# Patient Record
Sex: Female | Born: 1995 | Race: White | Hispanic: No | Marital: Single
Health system: Southern US, Community
[De-identification: ages and names within clinical notes are randomized; demographics above are authoritative.]

## PROBLEM LIST (undated history)

## (undated) DIAGNOSIS — F32A Depression, unspecified: Secondary | ICD-10-CM

## (undated) DIAGNOSIS — F419 Anxiety disorder, unspecified: Secondary | ICD-10-CM

## (undated) DIAGNOSIS — F329 Major depressive disorder, single episode, unspecified: Secondary | ICD-10-CM

## (undated) HISTORY — PX: TONSILLECTOMY: SUR1361

---

## 2016-02-18 ENCOUNTER — Ambulatory Visit: Payer: Medicaid Other

## 2016-02-18 ENCOUNTER — Ambulatory Visit
Admission: EM | Admit: 2016-02-18 | Discharge: 2016-02-18 | Disposition: A | Discharge status: Home / Self Care | Payer: Medicaid Other | Attending: Family Medicine | Admitting: Family Medicine

## 2016-02-18 DIAGNOSIS — R109 Unspecified abdominal pain: Secondary | ICD-10-CM | POA: Diagnosis present

## 2016-02-18 DIAGNOSIS — R1013 Epigastric pain: Secondary | ICD-10-CM | POA: Insufficient documentation

## 2016-02-18 DIAGNOSIS — Z3202 Encounter for pregnancy test, result negative: Secondary | ICD-10-CM | POA: Diagnosis not present

## 2016-02-18 DIAGNOSIS — D509 Iron deficiency anemia, unspecified: Secondary | ICD-10-CM | POA: Diagnosis not present

## 2016-02-18 HISTORY — DX: Major depressive disorder, single episode, unspecified: F32.9

## 2016-02-18 HISTORY — DX: Depression, unspecified: F32.A

## 2016-02-18 HISTORY — DX: Anxiety disorder, unspecified: F41.9

## 2016-02-18 LAB — COMPREHENSIVE METABOLIC PANEL
ALT: 12 U/L — ABNORMAL LOW (ref 14–54)
AST: 18 U/L (ref 15–41)
Albumin: 4.3 g/dL (ref 3.5–5.0)
Alkaline Phosphatase: 72 U/L (ref 38–126)
Anion gap: 9 (ref 5–15)
BUN: 7 mg/dL (ref 6–20)
CO2: 24 mmol/L (ref 22–32)
Calcium: 8.9 mg/dL (ref 8.9–10.3)
Chloride: 100 mmol/L — ABNORMAL LOW (ref 101–111)
Creatinine, Ser: 0.49 mg/dL (ref 0.44–1.00)
GFR calc Af Amer: >60 mL/min (ref >60)
GFR calc non Af Amer: >60 mL/min (ref >60)
Glucose, Bld: 89 mg/dL (ref 65–99)
Potassium: 3.6 mmol/L (ref 3.5–5.1)
Sodium: 133 mmol/L — ABNORMAL LOW (ref 135–145)
Total Bilirubin: 0.5 mg/dL (ref 0.3–1.2)
Total Protein: 7.6 g/dL (ref 6.5–8.1)

## 2016-02-18 LAB — CBC WITH DIFFERENTIAL/PLATELET
Basophils Absolute: 0 10*3/uL (ref 0–0.1)
Basophils Relative: 0 %
Eosinophils Absolute: 0 10*3/uL (ref 0–0.7)
Eosinophils Relative: 0 %
HCT: 31.5 % — ABNORMAL LOW (ref 35.0–47.0)
Hemoglobin: 9.7 g/dL — ABNORMAL LOW (ref 12.0–16.0)
Lymphocytes Relative: 30 %
Lymphs Abs: 1.2 10*3/uL (ref 1.0–3.6)
MCH: 21.8 pg — ABNORMAL LOW (ref 26.0–34.0)
MCHC: 30.9 g/dL — ABNORMAL LOW (ref 32.0–36.0)
MCV: 70.4 fL — ABNORMAL LOW (ref 80.0–100.0)
Monocytes Absolute: 0.4 10*3/uL (ref 0.2–0.9)
Monocytes Relative: 10 %
Neutro Abs: 2.4 10*3/uL (ref 1.4–6.5)
Neutrophils Relative %: 60 %
Platelets: 341 10*3/uL (ref 150–440)
RBC: 4.48 MIL/uL (ref 3.80–5.20)
RDW: 16.4 % — ABNORMAL HIGH (ref 11.5–14.5)
WBC: 4.1 10*3/uL (ref 3.6–11.0)

## 2016-02-18 LAB — URINALYSIS, COMPLETE (UACMP) WITH MICROSCOPIC
Bacteria, UA: NONE SEEN
Glucose, UA: NEGATIVE mg/dL
HGB URINE DIPSTICK: NEGATIVE
LEUKOCYTES UA: NEGATIVE
NITRITE: NEGATIVE
PROTEIN: NEGATIVE mg/dL
Specific Gravity, Urine: >1.03 — ABNORMAL HIGH (ref 1.005–1.030)
pH: 6 (ref 5.0–8.0)

## 2016-02-18 LAB — LIPASE, BLOOD: Lipase: 12 U/L (ref 11–51)

## 2016-02-18 LAB — PREGNANCY, URINE: Preg Test, Ur: NEGATIVE

## 2016-02-18 MED ORDER — DEXLANSOPRAZOLE 30 MG PO CPDR: 30.0000 mg | DELAYED_RELEASE_CAPSULE | Freq: Every day | ORAL | 0 refills | Status: DC

## 2016-02-18 NOTE — ED Triage Notes (Signed)
Patient complains of abdominal pain. Patient states that she has been having the pain x 3 weeks. Patient describes the pain as being mid abdomen with cramping at times off and on.

## 2016-02-18 NOTE — ED Provider Notes (Signed)
CSN: [id number]     Arrival date & time 02/18/16  1057 History   First MD Initiated Contact with Patient 02/18/16 1456     Chief Complaint  Patient presents with  . Abdominal Pain   (Consider location/radiation/quality/duration/timing/severity/associated sxs/prior Treatment) HPI  21 year old female who presents with abdominal pain that she indicates periumbilical. He said at times will feel as if it's radiating into the epigastric region. She has only vomited twice. States that the pain is intermittent but at times feels constant and worsens over a period of time but she is not specific as to the exact timing. She does not state that it relates to food or drink. He denies any diarrhea. Normal bowel movement was this morning. No urinary symptoms does not have any vaginal discharge. Her last menstrual cycle finished on Sunday. She has tried antinausea medicine that she got from someone else and states it did not help. History of GERD in the past and has doubled up on her proton pump inhibitors but has not noticed any improvement. His had no fever or chills.      Past Medical History:  Diagnosis Date  . Anxiety   . Depression    Past Surgical History:  Procedure Laterality Date  . TONSILLECTOMY     Family History  Problem Relation Age of Onset  . Neuropathy Mother    Social History  Substance Use Topics  . Smoking status: Never Smoker  . Smokeless tobacco: Never Used  . Alcohol use No   OB History    No data available     Review of Systems  Constitutional: Positive for activity change. Negative for chills, fatigue and fever.  Respiratory: Negative for cough and shortness of breath.   Gastrointestinal: Positive for abdominal pain, nausea and vomiting. Negative for abdominal distention, anal bleeding, blood in stool, constipation, diarrhea and rectal pain.  All other systems reviewed and are negative.   Allergies  Patient has no known allergies.  Home Medications   Prior  to Admission medications   Medication Sig Start Date End Date Taking? Authorizing Provider  busPIRone (BUSPAR) 15 MG tablet Take 15 mg by mouth 3 (three) times daily.   Yes Historical Provider, MD  DULoxetine (CYMBALTA) 60 MG capsule Take 60 mg by mouth daily.   Yes Historical Provider, MD  norethindrone-ethinyl estradiol-iron (ESTROSTEP FE,TILIA FE,TRI-LEGEST FE) 1-20/1-30/1-35 MG-MCG tablet Take 1 tablet by mouth daily.   Yes Historical Provider, MD  omeprazole (PRILOSEC) 40 MG capsule Take 40 mg by mouth daily.   Yes Historical Provider, MD  Dexlansoprazole (DEXILANT) 30 MG capsule Take 1 capsule (30 mg total) by mouth daily. 02/18/16   Lutricia FeilWilliam P Rosangela Fehrenbach, PA-C   Meds Ordered and Administered this Visit  Medications - No data to display  BP 128/83 (BP Location: Left Arm)   Pulse 60   Temp 98.3 F (36.8 C) (Oral)   Resp 18   Ht 5\' 1"  (1.549 m)   Wt 110 lb (49.9 kg)   LMP 02/10/2016 Comment: neg preg  SpO2 100%   BMI 20.78 kg/m  No data found.   Physical Exam  Constitutional: She is oriented to person, place, and time. She appears well-developed and well-nourished. No distress.  HENT:  Head: Normocephalic and atraumatic.  Right Ear: External ear normal.  Left Ear: External ear normal.  Nose: Nose normal.  Mouth/Throat: Oropharynx is clear and moist. No oropharyngeal exudate.  Eyes: EOM are normal. Pupils are equal, round, and reactive to light. Right eye exhibits  no discharge. Left eye exhibits no discharge.  Neck: Normal range of motion. Neck supple.  Pulmonary/Chest: Effort normal and breath sounds normal. No respiratory distress. She has no wheezes. She has no rales.  Abdominal: Soft. Bowel sounds are normal. She exhibits no distension and no mass. There is tenderness. There is no rebound and no guarding.  Patient has tenderness in the periumbilical region but most noticeable is the epigastric tenderness. She is a negative Murphy sign. No rebound or guarding present.   Musculoskeletal: Normal range of motion.  Lymphadenopathy:    She has no cervical adenopathy.  Neurological: She is alert and oriented to person, place, and time.  Skin: Skin is warm and dry. She is not diaphoretic.  Psychiatric: She has a normal mood and affect. Her behavior is normal. Judgment and thought content normal.  Nursing note and vitals reviewed.   Urgent Care Course     Procedures (including critical care time)  Labs Review Labs Reviewed  URINALYSIS, COMPLETE (UACMP) WITH MICROSCOPIC - Abnormal; Notable for the following:       Result Value   Specific Gravity, Urine >1.030 (*)    Bilirubin Urine SMALL (*)    Ketones, ur >160 (*)    Squamous Epithelial / LPF 6-30 (*)    All other components within normal limits  COMPREHENSIVE METABOLIC PANEL - Abnormal; Notable for the following:    Sodium 133 (*)    Chloride 100 (*)    ALT 12 (*)    All other components within normal limits  CBC WITH DIFFERENTIAL/PLATELET - Abnormal; Notable for the following:    Hemoglobin 9.7 (*)    HCT 31.5 (*)    MCV 70.4 (*)    MCH 21.8 (*)    MCHC 30.9 (*)    RDW 16.4 (*)    All other components within normal limits  PREGNANCY, URINE  LIPASE, BLOOD    Imaging Review Dg Abd Acute W/chest  Result Date: 02/18/2016 CLINICAL DATA:  Abdominal pain for several weeks EXAM: DG ABDOMEN ACUTE W/ 1V CHEST COMPARISON:  None. FINDINGS: There is no evidence of dilated bowel loops or free intraperitoneal air. No radiopaque calculi or other significant radiographic abnormality is seen. Heart size and mediastinal contours are within normal limits. Both lungs are clear. IMPRESSION: Negative abdominal radiographs.  No acute cardiopulmonary disease. Electronically Signed   By: Alcide Clever M.D.   On: 02/18/2016 16:36     Visual Acuity Review  Right Eye Distance:   Left Eye Distance:   Bilateral Distance:    Right Eye Near:   Left Eye Near:    Bilateral Near:         MDM   1. Epigastric  pain   2. Iron deficiency anemia, unspecified iron deficiency anemia type    Discharge Medication List as of 02/18/2016  5:07 PM    START taking these medications   Details  Dexlansoprazole (DEXILANT) 30 MG capsule Take 1 capsule (30 mg total) by mouth daily., Starting Tue 02/18/2016, Normal      Plan: 1. Test/x-ray results and diagnosis reviewed with patient 2. rx as per orders; risks, benefits, potential side effects reviewed with patient 3. Recommend supportive treatment with discontinuing her current PPI and switched to the new one. Follow-up with her primary care physician next week. All of her labs were normal except for her iron deficiency anemia. He did not know she had anemia. She states that her periods are not excessive. Need to have further workup.Marland Kitchen 4.  F/u prn if symptoms worsen or don't improve     Lutricia Feil, PA-C 02/18/16 1717

## 2019-09-07 ENCOUNTER — Other Ambulatory Visit: Payer: Self-pay

## 2019-09-07 ENCOUNTER — Ambulatory Visit (INDEPENDENT_AMBULATORY_CARE_PROVIDER_SITE_OTHER): Payer: Self-pay

## 2019-09-07 ENCOUNTER — Ambulatory Visit
Admission: EM | Admit: 2019-09-07 | Discharge: 2019-09-07 | Disposition: A | Discharge status: Home / Self Care | Payer: Self-pay | Attending: Family Medicine | Admitting: Family Medicine

## 2019-09-07 ENCOUNTER — Ambulatory Visit: Admit: 2019-09-07 | Disposition: A | Discharge status: Home / Self Care | Payer: Self-pay | Source: Home / Self Care

## 2019-09-07 DIAGNOSIS — S93602A Unspecified sprain of left foot, initial encounter: Secondary | ICD-10-CM

## 2019-09-07 DIAGNOSIS — M79672 Pain in left foot: Secondary | ICD-10-CM

## 2019-09-07 MED ORDER — MELOXICAM 15 MG PO TABS: 15.0000 mg | ORAL_TABLET | Freq: Every day | ORAL | 0 refills | Status: DC | PRN

## 2019-09-07 NOTE — ED Triage Notes (Signed)
Patient states that she fell down concrete steps on Monday and injured her left foot. Patient with abrasion and swelling to left foot. States the swelling has increased and pain has remained the same.

## 2019-09-07 NOTE — Discharge Instructions (Signed)
Rest, ice, elevation.  Medication as prescribed.  Take care  Dr. Daiki Dicostanzo  

## 2019-09-07 NOTE — ED Provider Notes (Signed)
[facility]    CSN: [id number] Arrival date & time: 09/07/19  1920      History   Chief Complaint Chief Complaint  Patient presents with   Foot Pain   Fall   HPI  24 year old female presents with the above complaints.  Patient states that she fell down some concrete steps on Monday and injured her left foot.  She has an abrasion to the dorsum of her foot.  She reports pain and swelling of the dorsum of her foot.  She went to work today and she has significant swelling currently.  This is likely due to being on her feet all day.  Pain 7/10 in severity.  Denies ankle pain.  She is currently on crutches and has difficulty ambulating.  No other associated symptoms.  No other complaints.  Past Medical History:  Diagnosis Date   Anxiety    Depression    Past Surgical History:  Procedure Laterality Date   TONSILLECTOMY     OB History   No obstetric history on file.    Home Medications    Prior to Admission medications   Medication Sig Start Date End Date Taking? Authorizing Provider  norethindrone-ethinyl estradiol-iron (ESTROSTEP FE,TILIA FE,TRI-LEGEST FE) 1-20/1-30/1-35 MG-MCG tablet Take 1 tablet by mouth daily.   Yes [provider]  meloxicam (MOBIC) 15 MG tablet Take 1 tablet (15 mg total) by mouth daily as needed for pain. 09/07/19   Tommie Sams, DO  Dexlansoprazole (DEXILANT) 30 MG capsule Take 1 capsule (30 mg total) by mouth daily. 02/18/16 09/07/19  Lutricia Feil, PA-C  DULoxetine (CYMBALTA) 60 MG capsule Take 60 mg by mouth daily.  09/07/19  [provider]  omeprazole (PRILOSEC) 40 MG capsule Take 40 mg by mouth daily.  09/07/19  [provider]   Family History Family History  Problem Relation Age of Onset   Neuropathy Mother    Social History Social History   Tobacco Use   Smoking status: Never Smoker   Smokeless tobacco: Never Used  Building services engineer Use: Never used  Substance Use Topics   Alcohol use:  No   Drug use: Yes    Types: Marijuana   Allergies   Patient has no known allergies.  Review of Systems Review of Systems  Musculoskeletal:       Left foot pain, swelling.  Skin: Positive for wound.   Physical Exam Triage Vital Signs ED Triage Vitals  Enc Vitals Group     BP 09/07/19 2005 128/89     Pulse Rate 09/07/19 2005 83     Resp 09/07/19 2005 16     Temp 09/07/19 2005 98.6 F (37 C)     Temp Source 09/07/19 2005 Oral     SpO2 09/07/19 2005 100 %     Weight 09/07/19 2002 120 lb (54.4 kg)     Height 09/07/19 2002 5\' 1"  (1.549 m)     Head Circumference --      Peak Flow --      Pain Score 09/07/19 2002 7     Pain Loc --      Pain Edu? --      Excl. in GC? --    Updated Vital Signs BP 128/89 (BP Location: Right Arm)    Pulse 83    Temp 98.6 F (37 C) (Oral)    Resp 16    Ht 5\' 1"  (1.549 m)    Wt 54.4 kg    LMP  08/17/2019    SpO2 100%    BMI 22.67 kg/m   Visual Acuity Right Eye Distance:   Left Eye Distance:   Bilateral Distance:    Right Eye Near:   Left Eye Near:    Bilateral Near:     Physical Exam Vitals and nursing note reviewed.  Constitutional:      General: She is not in acute distress.    Appearance: Normal appearance. She is not ill-appearing.  HENT:     Head: Normocephalic and atraumatic.  Eyes:     General:        Right eye: No discharge.        Left eye: No discharge.     Conjunctiva/sclera: Conjunctivae normal.  Pulmonary:     Effort: Pulmonary effort is normal. No respiratory distress.  Musculoskeletal:     Comments: Left foot -tenderness over the dorsum of the foot.  Abrasion noted.  Mild swelling.  Neurological:     Mental Status: She is alert.  Psychiatric:        Mood and Affect: Mood normal.        Behavior: Behavior normal.    UC Treatments / Results  Labs (all labs ordered are listed, but only abnormal results are displayed) Labs Reviewed - No data to display  EKG   Radiology DG Foot Complete Left  Result Date:  09/07/2019 CLINICAL DATA:  Larey Seat down stairs 3 days ago, left foot pain EXAM: LEFT FOOT - COMPLETE 3+ VIEW COMPARISON:  None. FINDINGS: Frontal, oblique, and lateral views of the left foot are obtained. No acute displaced fracture, subluxation, or dislocation. Dorsal soft tissue swelling of the forefoot. Joint spaces are well preserved. IMPRESSION: 1. Dorsal soft tissue swelling of the forefoot.  No acute fracture. Electronically Signed   By: Sharlet Salina M.D.   On: 09/07/2019 20:24    Procedures Procedures (including critical care time)  Medications Ordered in UC Medications - No data to display  Initial Impression / Assessment and Plan / UC Course  I have reviewed the triage vital signs and the nursing notes.  Pertinent labs & imaging results that were available during my care of the patient were reviewed by me and considered in my medical decision making (see chart for details).    24 year old female presents with a foot injury.  X-ray obtained and independently reviewed by me.  Interpretation: No acute fracture.  Advise rest, ice, elevation.  Meloxicam as directed.  Patient already has crutches.  Supportive care.  Final Clinical Impressions(s) / UC Diagnoses   Final diagnoses:  Sprain of left foot, initial encounter     Discharge Instructions     Rest, ice, elevation.  Medication as prescribed.  Take care  Dr. Adriana Simas    ED Prescriptions    Medication Sig Dispense Auth. Provider   meloxicam (MOBIC) 15 MG tablet Take 1 tablet (15 mg total) by mouth daily as needed for pain. 30 tablet Tommie Sams, DO     PDMP not reviewed this encounter.   Tommie Sams, [place] 09/07/19 2139

## 2020-01-14 ENCOUNTER — Other Ambulatory Visit: Payer: Self-pay

## 2020-01-14 DIAGNOSIS — Z20822 Contact with and (suspected) exposure to covid-19: Secondary | ICD-10-CM

## 2020-01-16 LAB — NOVEL CORONAVIRUS, NAA: SARS-CoV-2, NAA: NOT DETECTED

## 2020-01-16 LAB — SARS-COV-2, NAA 2 DAY TAT

## 2021-01-13 IMAGING — CR DG FOOT COMPLETE 3+V*L*
3 series · 3 of 3 positions shown · non-contrast
Comparison: None.

CLINICAL DATA: Fell down stairs 3 days ago, left foot pain

EXAM:
LEFT FOOT - COMPLETE 3+ VIEW

[foot ap]
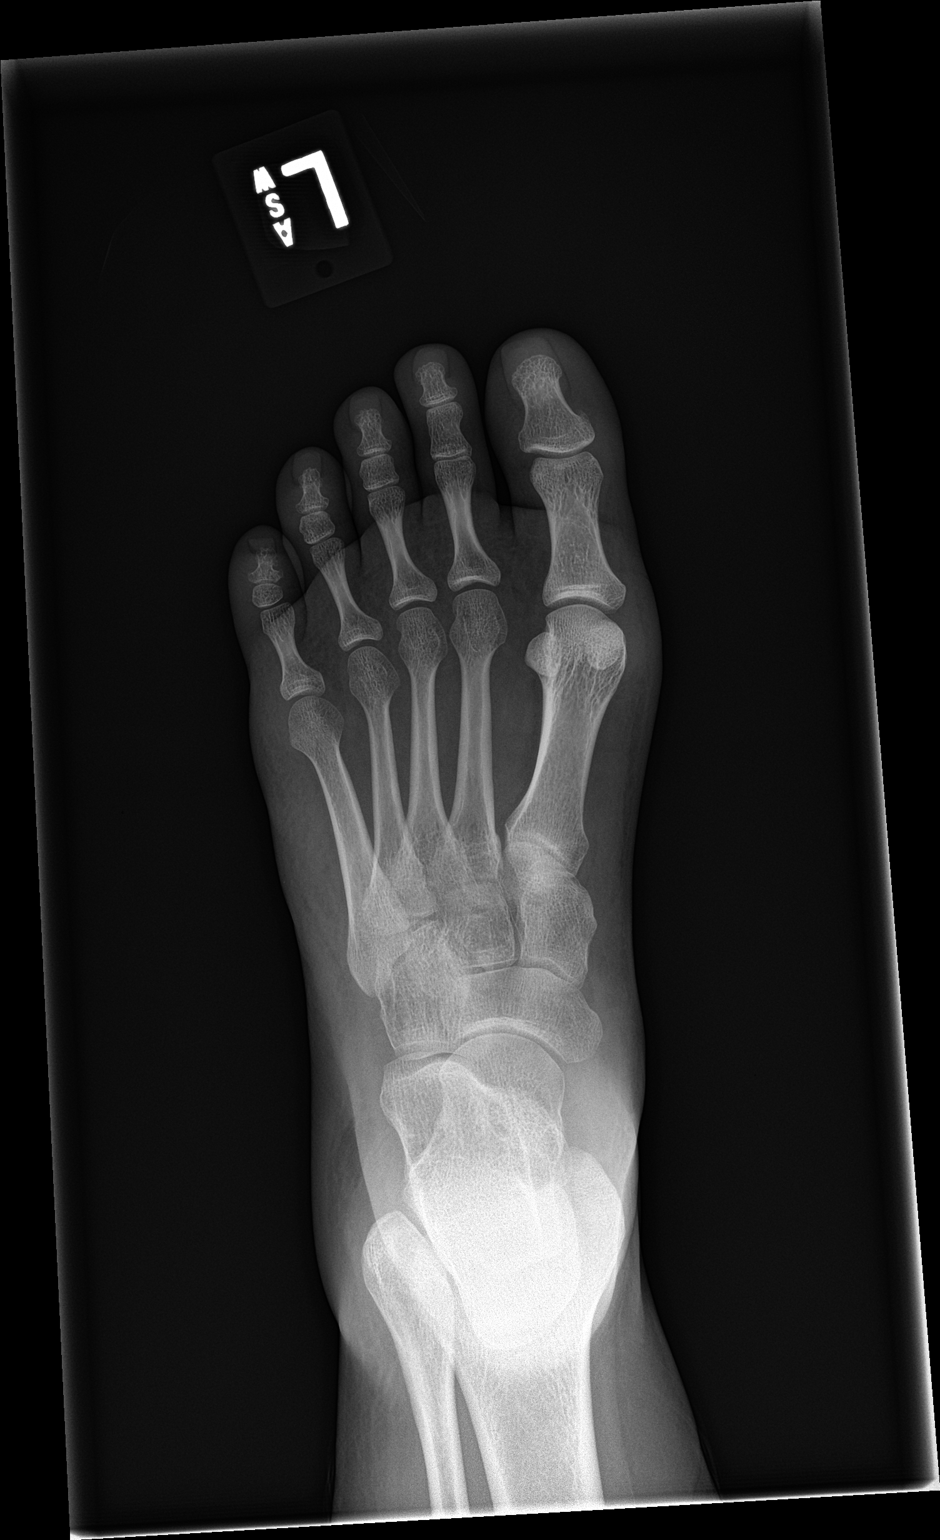

[foot obl]
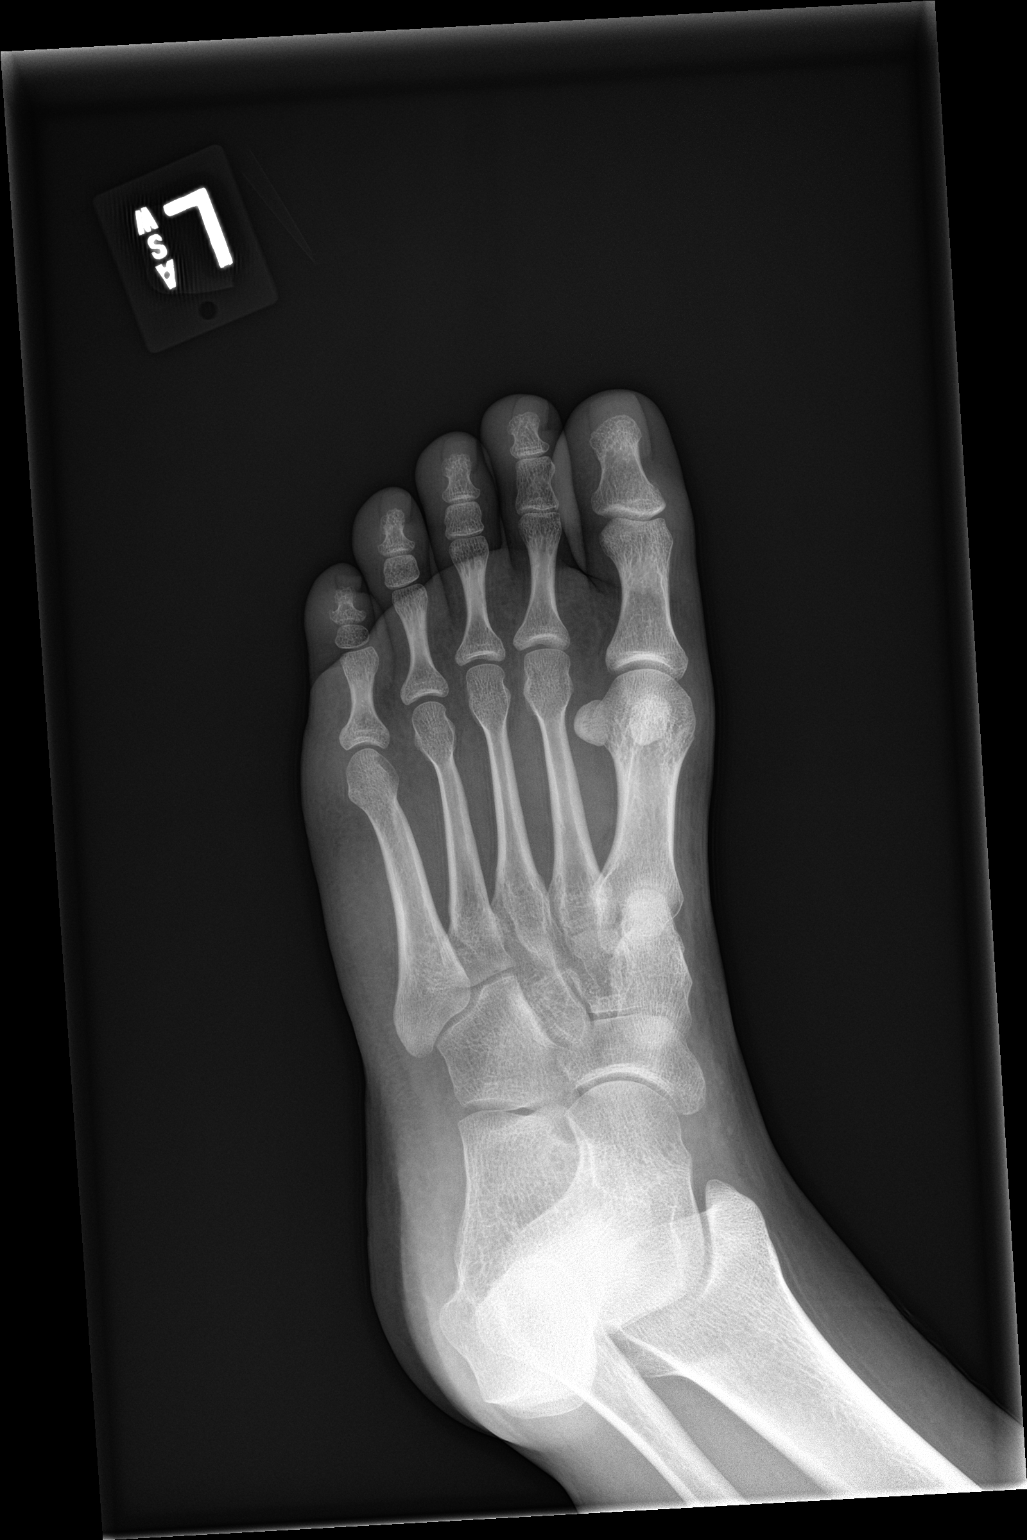

[foot lat]
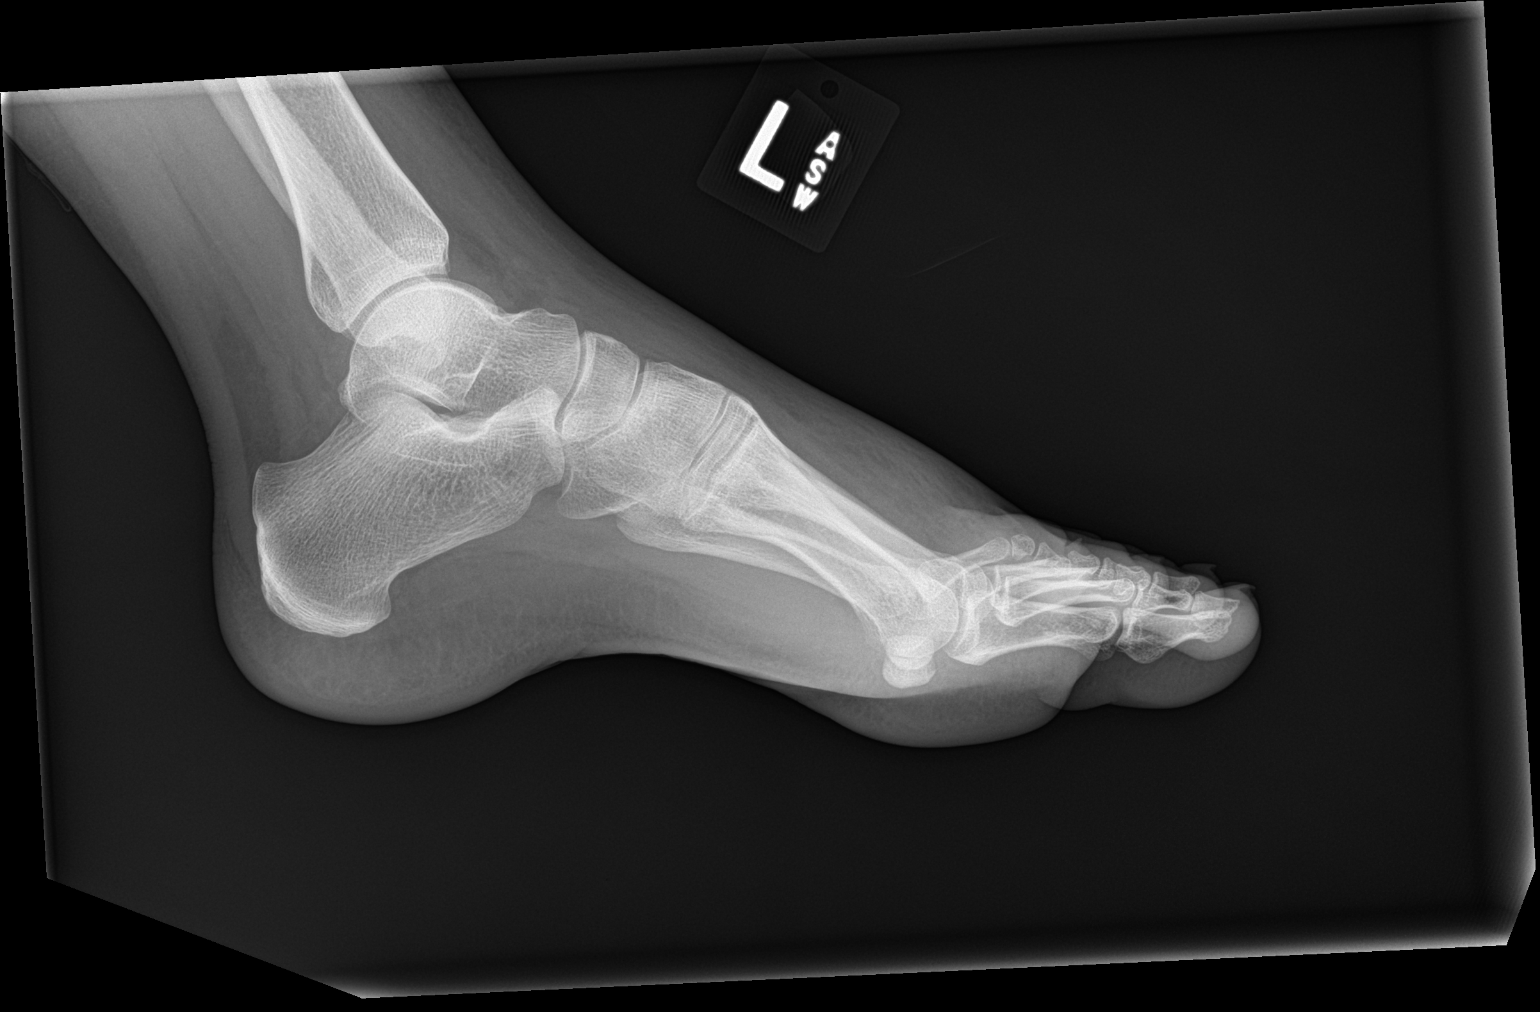

[3 of 3 positions shown; findings below may reference images not displayed]

FINDINGS: Frontal, oblique, and lateral views of the left foot are obtained.
No acute displaced fracture, subluxation, or dislocation. Dorsal
soft tissue swelling of the forefoot. Joint spaces are well
preserved.
IMPRESSION: 1. Dorsal soft tissue swelling of the forefoot.  No acute fracture.

## 2022-08-06 ENCOUNTER — Ambulatory Visit
Admission: EM | Admit: 2022-08-06 | Discharge: 2022-08-06 | Disposition: A | Discharge status: Home / Self Care | Payer: 59 | Attending: Emergency Medicine | Admitting: Emergency Medicine

## 2022-08-06 DIAGNOSIS — B9689 Other specified bacterial agents as the cause of diseases classified elsewhere: Secondary | ICD-10-CM | POA: Diagnosis present

## 2022-08-06 DIAGNOSIS — B372 Candidiasis of skin and nail: Secondary | ICD-10-CM | POA: Insufficient documentation

## 2022-08-06 DIAGNOSIS — N760 Acute vaginitis: Secondary | ICD-10-CM | POA: Diagnosis present

## 2022-08-06 LAB — URINALYSIS, W/ REFLEX TO CULTURE (INFECTION SUSPECTED)
Bilirubin Urine: NEGATIVE
Glucose, UA: NEGATIVE mg/dL
Hgb urine dipstick: NEGATIVE
Ketones, ur: NEGATIVE mg/dL
Leukocytes,Ua: NEGATIVE
Nitrite: NEGATIVE
Protein, ur: NEGATIVE mg/dL
Specific Gravity, Urine: 1.015 (ref 1.005–1.030)
pH: 5.5 (ref 5.0–8.0)

## 2022-08-06 LAB — WET PREP, GENITAL
Sperm: NONE SEEN
Trich, Wet Prep: NONE SEEN
WBC, Wet Prep HPF POC: NONE SEEN — AB (ref <10)
Yeast Wet Prep HPF POC: NONE SEEN

## 2022-08-06 MED ORDER — METRONIDAZOLE 500 MG PO TABS: 500.0000 mg | ORAL_TABLET | Freq: Two times a day (BID) | ORAL | 0 refills | Status: DC

## 2022-08-06 MED ORDER — FLUCONAZOLE 150 MG PO TABS
150.0000 mg | ORAL_TABLET | ORAL | 0 refills | Status: AC
Start: 2022-08-06 — End: 2022-08-13

## 2022-08-06 MED ORDER — CLOTRIMAZOLE-BETAMETHASONE 1-0.05 % EX CREA: TOPICAL_CREAM | CUTANEOUS | 0 refills | Status: DC

## 2022-08-06 NOTE — Discharge Instructions (Addendum)
Take the Flagyl (metronidazole) 500 mg twice daily for treatment of your bacterial vaginosis.  Avoid alcohol while on the metronidazole as taken together will cause of vomiting.  Bacterial vaginosis is often caused by a imbalance of bacteria in your vaginal vault.  This is sometimes a result of using tampons or hormonal fluctuations during her menstrual cycle.  You if your symptoms are recurrent you can try using a boric acid suppository twice weekly to help maintain the acid-base balance in your vagina vault which could prevent further infection.  You can also try vaginal probiotics to help return normal bacterial balance.   For the yeast infection under both her breasts apply the Lotrisone cream twice daily until you have a resolution of symptoms and then for an additional 3 days.  Leave the areas open to air is much as possible to allow them to dry out and help kill the yeast.  Make sure that you are drying the areas thoroughly after showering with a hair dryer to prevent moisture from collecting on the skin and preventing yeast growth.  If you develop any symptoms of the vaginal yeast infection please start taking the Diflucan 150 mg tablets.  Take 1 tablet at onset of symptoms and repeat every 3 days for total of 3 doses.

## 2022-08-06 NOTE — ED Triage Notes (Signed)
Patient presents to [facility] for vaginal discharge, odor since yesterday. States hx of BV. States she also concerned with a rash under breast area. Treating rash with coconut oil.

## 2022-08-06 NOTE — ED Provider Notes (Signed)
[facility]    CSN: [id number] Arrival date & time: 08/06/22  1110      History   Chief Complaint Chief Complaint  Patient presents with   Vaginal Discharge   Rash    HPI Marissa Pearson is a 27 y.o. female.   HPI  27 year old female with a past medical history significant for anxiety and depression presents for evaluation of vaginal discharge with odor that started yesterday.  She does have a history of BV and is concerned that this may be what is occurring.  She does endorse some urinary frequency but she denies pain with urination or urinary urgency.  She also denies fever, low back pain, abdominal pain, nausea, or vomiting.  Additionally, she has had a red, itchy rash under both breasts that have been present for the past week.  She did recently change her laundry detergent so she is wondering if she might be having allergic reaction.  She has had some mild redness to the flexor creases of both elbows but no rashes elsewhere on her body.  Past Medical History:  Diagnosis Date   Anxiety    Depression     There are no problems to display for this patient.   Past Surgical History:  Procedure Laterality Date   TONSILLECTOMY      OB History   No obstetric history on file.      Home Medications    Prior to Admission medications   Medication Sig Start Date End Date Taking? Authorizing Provider  clotrimazole-betamethasone (LOTRISONE) cream Apply to affected area 2 times daily prn 08/06/22  Yes Becky Augusta, NP  fluconazole (DIFLUCAN) 150 MG tablet Take 1 tablet (150 mg total) by mouth every 3 (three) days for 3 doses. 08/06/22 08/13/22 Yes Becky Augusta, NP  metroNIDAZOLE (FLAGYL) 500 MG tablet Take 1 tablet (500 mg total) by mouth 2 (two) times daily. 08/06/22  Yes Becky Augusta, NP  meloxicam (MOBIC) 15 MG tablet Take 1 tablet (15 mg total) by mouth daily as needed for pain. 09/07/19   Tommie Sams, DO  norethindrone-ethinyl estradiol-iron (ESTROSTEP FE,TILIA  FE,TRI-LEGEST FE) 1-20/1-30/1-35 MG-MCG tablet Take 1 tablet by mouth daily.    [provider]  Dexlansoprazole (DEXILANT) 30 MG capsule Take 1 capsule (30 mg total) by mouth daily. 02/18/16 09/07/19  Lutricia Feil, PA-C  DULoxetine (CYMBALTA) 60 MG capsule Take 60 mg by mouth daily.  09/07/19  [provider]  omeprazole (PRILOSEC) 40 MG capsule Take 40 mg by mouth daily.  09/07/19  [provider]    Family History Family History  Problem Relation Age of Onset   Neuropathy Mother     Social History Social History   Tobacco Use   Smoking status: Never   Smokeless tobacco: Never  Vaping Use   Vaping status: Never Used  Substance Use Topics   Alcohol use: No   Drug use: Yes    Types: Marijuana     Allergies   Patient has no known allergies.   Review of Systems Review of Systems  Constitutional:  Negative for fever.  Gastrointestinal:  Negative for abdominal pain, nausea and vomiting.  Genitourinary:  Positive for frequency, vaginal discharge and vaginal pain. Negative for dysuria, hematuria and urgency.  Musculoskeletal:  Negative for back pain.  Skin:  Positive for rash.     Physical Exam Triage Vital Signs ED Triage Vitals  Encounter Vitals Group     BP      Systolic BP Percentile  Diastolic BP Percentile      Pulse      Resp      Temp      Temp src      SpO2      Weight      Height      Head Circumference      Peak Flow      Pain Score      Pain Loc      Pain Education      Exclude from Growth Chart    No data found.  Updated Vital Signs BP (!) 129/99 (BP Location: Left Arm)   Pulse 64   Temp 98.3 F (36.8 C) (Oral)   Resp 16   LMP 07/06/2022   SpO2 99%   Visual Acuity Right Eye Distance:   Left Eye Distance:   Bilateral Distance:    Right Eye Near:   Left Eye Near:    Bilateral Near:     Physical Exam Vitals and nursing note reviewed.  Constitutional:      Appearance: Normal appearance. She is not  ill-appearing.  HENT:     Head: Normocephalic and atraumatic.  Cardiovascular:     Rate and Rhythm: Normal rate and regular rhythm.     Pulses: Normal pulses.     Heart sounds: Normal heart sounds. No murmur heard.    No friction rub. No gallop.  Pulmonary:     Effort: Pulmonary effort is normal.     Breath sounds: Normal breath sounds. No wheezing, rhonchi or rales.  Abdominal:     Tenderness: There is no right CVA tenderness or left CVA tenderness.  Skin:    General: Skin is warm and dry.     Capillary Refill: Capillary refill takes less than 2 seconds.     Findings: Rash present.  Neurological:     General: No focal deficit present.     Mental Status: She is alert and oriented to person, place, and time.      UC Treatments / Results  Labs (all labs ordered are listed, but only abnormal results are displayed) Labs Reviewed  WET PREP, GENITAL - Abnormal; Notable for the following components:      Result Value   Clue Cells Wet Prep HPF POC PRESENT (*)    WBC, Wet Prep HPF POC NONE SEEN (*)    All other components within normal limits  URINALYSIS, W/ REFLEX TO CULTURE (INFECTION SUSPECTED) - Abnormal; Notable for the following components:   Bacteria, UA FEW (*)    All other components within normal limits    EKG   Radiology No results found.  Procedures Procedures (including critical care time)  Medications Ordered in UC Medications - No data to display  Initial Impression / Assessment and Plan / UC Course  I have reviewed the triage vital signs and the nursing notes.  Pertinent labs & imaging results that were available during my care of the patient were reviewed by me and considered in my medical decision making (see chart for details).   Patient is a nontoxic-appearing 27 year old female presenting for evaluation of vaginal discharge and skin rash as outlined in HPI above.  The patient's rash has been there for the last week and on exam she has an erythematous  macular rash under both breasts.  There is no greasy coating or discharge.  It is not hot to touch.  I suspect that it is a fungal infection due to it being in the skin fold.  I will treat her with Lotrisone twice daily for 7 days.  Of also advised her to keep the area open to the air is much as possible and if she can avoid wearing a bra around the house that can also help with her comfort level.  She should place gauze underneath both breasts to allow airflow to the area in question and allow the area to dry out.  With the worst of the patient's urinary frequency and vaginal discharge I will order urinalysis and vaginal wet prep to evaluate for the presence of BV, as patient has a history of BV, UTI, or vaginal yeast infection.  Urinalysis is negative for leukocyte esterase, nitrates, or protein.  Reflex microscopy shows few bacteria but is otherwise unremarkable.  Vaginal wet prep is positive for clue cells.  I will discharge patient home with diagnosis of candidal infection under both breasts and bacterial vaginosis.  I will put her on Lotrisone for the yeast infection under both breasts and put her on metronidazole 500 mg twice daily for 7 days for treatment of BV.   Final Clinical Impressions(s) / UC Diagnoses   Final diagnoses:  BV (bacterial vaginosis)  Yeast infection of the skin     Discharge Instructions      Take the Flagyl (metronidazole) 500 mg twice daily for treatment of your bacterial vaginosis.  Avoid alcohol while on the metronidazole as taken together will cause of vomiting.  Bacterial vaginosis is often caused by a imbalance of bacteria in your vaginal vault.  This is sometimes a result of using tampons or hormonal fluctuations during her menstrual cycle.  You if your symptoms are recurrent you can try using a boric acid suppository twice weekly to help maintain the acid-base balance in your vagina vault which could prevent further infection.  You can also try vaginal  probiotics to help return normal bacterial balance.   For the yeast infection under both her breasts apply the Lotrisone cream twice daily until you have a resolution of symptoms and then for an additional 3 days.  Leave the areas open to air is much as possible to allow them to dry out and help kill the yeast.  Make sure that you are drying the areas thoroughly after showering with a hair dryer to prevent moisture from collecting on the skin and preventing yeast growth.  If you develop any symptoms of the vaginal yeast infection please start taking the Diflucan 150 mg tablets.  Take 1 tablet at onset of symptoms and repeat every 3 days for total of 3 doses.     ED Prescriptions     Medication Sig Dispense Auth. Provider   metroNIDAZOLE (FLAGYL) 500 MG tablet Take 1 tablet (500 mg total) by mouth 2 (two) times daily. 14 tablet Becky Augusta, NP   fluconazole (DIFLUCAN) 150 MG tablet Take 1 tablet (150 mg total) by mouth every 3 (three) days for 3 doses. 3 tablet Becky Augusta, NP   clotrimazole-betamethasone (LOTRISONE) cream Apply to affected area 2 times daily prn 45 g Becky Augusta, NP      PDMP not reviewed this encounter.   Becky Augusta, NP 08/06/22 1157

## 2022-12-11 ENCOUNTER — Ambulatory Visit
Admission: EM | Admit: 2022-12-11 | Discharge: 2022-12-11 | Disposition: A | Discharge status: Home / Self Care | Payer: 59 | Attending: Emergency Medicine | Admitting: Emergency Medicine

## 2022-12-11 DIAGNOSIS — U071 COVID-19: Secondary | ICD-10-CM

## 2022-12-11 MED ORDER — PREDNISONE 20 MG PO TABS: 40.0000 mg | ORAL_TABLET | Freq: Every day | ORAL | 0 refills | Status: DC

## 2022-12-11 MED ORDER — NYSTATIN 100000 UNIT/ML MT SUSP: 5.0000 mL | Freq: Four times a day (QID) | OROMUCOSAL | 1 refills | Status: DC | PRN

## 2022-12-11 NOTE — ED Triage Notes (Signed)
Patient diagnosed with COVID yesterday.  Patient had negative flu and strep test yesterday.  Patient reports sore throat that has gotten better with anything over the counter.

## 2022-12-11 NOTE — ED Provider Notes (Signed)
[facility]    CSN: [id number] Arrival date & time: 12/11/22  1747      History   Chief Complaint Chief Complaint  Patient presents with   Covid Positive   Sore Throat    HPI Marissa Pearson is a 27 y.o. female.   Patient presents for evaluation of subjective fever, chills, nasal congestion, rhinorrhea, sore throat and a nonproductive cough present for 3 days.  Associated intermittent generalized headaches which have resolved.  Sore throat most worrisome symptom, pain with swallowing described as nausea fluid in the throat.  Has attempted use of cough drops, warm tea and popsicles with minimal relief.  Was evaluated in a [facility] urgent care 1 day ago, testing positive for COVID, negative for strep and influenza.     Past Medical History:  Diagnosis Date   Anxiety    Depression     There are no problems to display for this patient.   Past Surgical History:  Procedure Laterality Date   TONSILLECTOMY      OB History   No obstetric history on file.      Home Medications    Prior to Admission medications   Medication Sig Start Date End Date Taking? Authorizing Provider  clotrimazole-betamethasone (LOTRISONE) cream Apply to affected area 2 times daily prn 08/06/22   Becky Augusta, NP  meloxicam (MOBIC) 15 MG tablet Take 1 tablet (15 mg total) by mouth daily as needed for pain. 09/07/19   Tommie Sams, DO  metroNIDAZOLE (FLAGYL) 500 MG tablet Take 1 tablet (500 mg total) by mouth 2 (two) times daily. 08/06/22   Becky Augusta, NP  norethindrone-ethinyl estradiol-iron (ESTROSTEP FE,TILIA FE,TRI-LEGEST FE) 1-20/1-30/1-35 MG-MCG tablet Take 1 tablet by mouth daily.    [provider]  Dexlansoprazole (DEXILANT) 30 MG capsule Take 1 capsule (30 mg total) by mouth daily. 02/18/16 09/07/19  Lutricia Feil, PA-C  DULoxetine (CYMBALTA) 60 MG capsule Take 60 mg by mouth daily.  09/07/19  [provider]  omeprazole (PRILOSEC) 40 MG capsule Take 40 mg by mouth  daily.  09/07/19  [provider]    Family History Family History  Problem Relation Age of Onset   Neuropathy Mother     Social History Social History   Tobacco Use   Smoking status: Never   Smokeless tobacco: Never  Vaping Use   Vaping status: Never Used  Substance Use Topics   Alcohol use: No   Drug use: Yes    Types: Marijuana     Allergies   Patient has no known allergies.   Review of Systems Review of Systems  Constitutional:  Positive for chills and fever. Negative for activity change, appetite change, diaphoresis, fatigue and unexpected weight change.  HENT:  Positive for congestion, rhinorrhea and sore throat. Negative for dental problem, drooling, ear discharge, ear pain, facial swelling, hearing loss, mouth sores, nosebleeds, postnasal drip, sinus pressure, sinus pain, sneezing, tinnitus, trouble swallowing and voice change.   Respiratory:  Positive for cough. Negative for apnea, choking, chest tightness, shortness of breath, wheezing and stridor.   Cardiovascular: Negative.   Gastrointestinal: Negative.      Physical Exam Triage Vital Signs ED Triage Vitals  Encounter Vitals Group     BP 12/11/22 1801 (!) 139/92     Systolic BP Percentile --      Diastolic BP Percentile --      Pulse Rate 12/11/22 1801 (!) 111     Resp 12/11/22 1801 14  Temp 12/11/22 1801 100 F (37.8 C)     Temp Source 12/11/22 1801 Oral     SpO2 12/11/22 1801 97 %     Weight 12/11/22 1759 119 lb 14.9 oz (54.4 kg)     Height 12/11/22 1759 5\' 1"  (1.549 m)     Head Circumference --      Peak Flow --      Pain Score 12/11/22 1759 10     Pain Loc --      Pain Education --      Exclude from Growth Chart --    No data found.  Updated Vital Signs BP (!) 139/92 (BP Location: Right Arm)   Pulse (!) 111   Temp 100 F (37.8 C) (Oral)   Resp 14   Ht 5\' 1"  (1.549 m)   Wt 119 lb 14.9 oz (54.4 kg)   LMP 11/24/2022 (Approximate)   SpO2 97%   BMI 22.66 kg/m   Visual  Acuity Right Eye Distance:   Left Eye Distance:   Bilateral Distance:    Right Eye Near:   Left Eye Near:    Bilateral Near:     Physical Exam Constitutional:      Appearance: Normal appearance.  HENT:     Head: Normocephalic.     Right Ear: Tympanic membrane, ear canal and external ear normal.     Left Ear: Tympanic membrane, ear canal and external ear normal.     Nose: Congestion present. No rhinorrhea.     Mouth/Throat:     Pharynx: No oropharyngeal exudate or posterior oropharyngeal erythema.  Eyes:     Extraocular Movements: Extraocular movements intact.  Cardiovascular:     Rate and Rhythm: Normal rate and regular rhythm.     Pulses: Normal pulses.     Heart sounds: Normal heart sounds.  Pulmonary:     Effort: Pulmonary effort is normal.     Breath sounds: Normal breath sounds.  Musculoskeletal:     Cervical back: Normal range of motion.  Lymphadenopathy:     Cervical: Cervical adenopathy present.  Neurological:     Mental Status: She is alert and oriented to person, place, and time. Mental status is at baseline.      UC Treatments / Results  Labs (all labs ordered are listed, but only abnormal results are displayed) Labs Reviewed - No data to display  EKG   Radiology No results found.  Procedures Procedures (including critical care time)  Medications Ordered in UC Medications - No data to display  Initial Impression / Assessment and Plan / UC Course  I have reviewed the triage vital signs and the nursing notes.  Pertinent labs & imaging results that were available during my care of the patient were reviewed by me and considered in my medical decision making (see chart for details).  COVID-19  Patient is in no signs of distress nor toxic appearing.  Vital signs are stable.  Low suspicion for pneumonia, pneumothorax or bronchitis and therefore will defer imaging.  Etiology is viral, with patient will not repeat strep testing as there is no erythema,  exudate or tonsillar adenopathy on exam.  Prescribed prednisone and Magic mouthwash as attempt to decrease discomfort.May use additional over-the-counter medications as needed for supportive care.  May follow-up with urgent care as needed if symptoms persist or worsen.  Final Clinical Impressions(s) / UC Diagnoses   Final diagnoses:  None   Discharge Instructions   None    ED Prescriptions  None    PDMP not reviewed this encounter.   Valinda Hoar, NP 12/11/22 1836

## 2022-12-11 NOTE — Discharge Instructions (Signed)
Your symptoms today are most likely being caused by a virus and should steadily improve in time it can take up to 7 to 10 days before you truly start to see a turnaround however things will get better  In an attempt to help reduce throat pain starting tomorrow take prednisone every morning with food for 5 days, you may use Tylenol while using this medicine  You may gargle and spit Magic mouthwash solution every 4 hours as needed to provide temporary relief to your throat   For cough: honey 1/2 to 1 teaspoon (you can dilute the honey in water or another fluid).  You can also use guaifenesin and dextromethorphan for cough. You can use a humidifier for chest congestion and cough.  If you don't have a humidifier, you can sit in the bathroom with the hot shower running.      For sore throat: try warm salt water gargles, cepacol lozenges, throat spray, warm tea or water with lemon/honey, popsicles or ice, or OTC cold relief medicine for throat discomfort.   For congestion: take a daily anti-histamine like Zyrtec, Claritin, and a oral decongestant, such as pseudoephedrine.  You can also use Flonase 1-2 sprays in each nostril daily.   It is important to stay hydrated: drink plenty of fluids (water, gatorade/powerade/pedialyte, juices, or teas) to keep your throat moisturized and help further relieve irritation/discomfort.

## 2023-10-22 ENCOUNTER — Encounter: Payer: Self-pay | Admitting: Emergency Medicine

## 2023-10-22 ENCOUNTER — Ambulatory Visit
Admission: EM | Admit: 2023-10-22 | Discharge: 2023-10-22 | Disposition: A | Discharge status: Home / Self Care | Attending: Emergency Medicine | Admitting: Emergency Medicine

## 2023-10-22 DIAGNOSIS — N760 Acute vaginitis: Secondary | ICD-10-CM | POA: Insufficient documentation

## 2023-10-22 DIAGNOSIS — B3731 Acute candidiasis of vulva and vagina: Secondary | ICD-10-CM | POA: Diagnosis present

## 2023-10-22 DIAGNOSIS — Z113 Encounter for screening for infections with a predominantly sexual mode of transmission: Secondary | ICD-10-CM | POA: Diagnosis present

## 2023-10-22 DIAGNOSIS — B9689 Other specified bacterial agents as the cause of diseases classified elsewhere: Secondary | ICD-10-CM | POA: Insufficient documentation

## 2023-10-22 LAB — URINALYSIS, W/ REFLEX TO CULTURE (INFECTION SUSPECTED)
Bilirubin Urine: NEGATIVE
Glucose, UA: NEGATIVE mg/dL
Hgb urine dipstick: NEGATIVE
Ketones, ur: NEGATIVE mg/dL
Leukocytes,Ua: NEGATIVE
Nitrite: NEGATIVE
Protein, ur: NEGATIVE mg/dL
RBC / HPF: NONE SEEN RBC/hpf (ref 0–5)
Specific Gravity, Urine: 1.025 (ref 1.005–1.030)
WBC, UA: NONE SEEN WBC/hpf (ref 0–5)
pH: 7 (ref 5.0–8.0)

## 2023-10-22 MED ORDER — MICONAZOLE NITRATE 2 % EX CREA: 1.0000 | TOPICAL_CREAM | Freq: Two times a day (BID) | CUTANEOUS | 0 refills | Status: AC

## 2023-10-22 MED ORDER — FLUCONAZOLE 150 MG PO TABS: 150.0000 mg | ORAL_TABLET | Freq: Once | ORAL | 1 refills | Status: AC

## 2023-10-22 MED ORDER — METRONIDAZOLE 500 MG PO TABS: 500.0000 mg | ORAL_TABLET | Freq: Two times a day (BID) | ORAL | 0 refills | Status: AC

## 2023-10-22 NOTE — Discharge Instructions (Signed)
 Take the medication as written.  We are checking for BV, yeast, gonorrhea, chlamydia, trichomonas.  Give us  a working phone number so that we can contact you if needed. Refrain from sexual contact until all of your labs have come back, symptoms have resolved, and your partner(s) are treated if necessary. Return to the ER if you get worse, have a fever >100.4, or for any concerns.   Diflucan  and miconazole for yeast, Flagyl  for BV.  Go to www.goodrx.com  or www.costplusdrugs.com to look up your medications. This will give you a list of where you can find your prescriptions at the most affordable prices. Or ask the pharmacist what the cash price is, or if they have any other discount programs available to help make your medication more affordable. This can be less expensive than what you would pay with insurance.

## 2023-10-22 NOTE — ED Triage Notes (Signed)
 Patient c/o vaginal discharge and itching that started on Monday.  Patient also reports some dysuria.  Patient wants STD testing.

## 2023-10-22 NOTE — ED Provider Notes (Signed)
 HPI  SUBJECTIVE:  Marissa Pearson is a 28 y.o. female who presents with 4 days of yellow, odorous vaginal discharge that has now become white, thick, nonodorous after using a boric acid suppository, vaginal itching.  No urinary complaints.  No nausea, vomiting, abdominal, back, pelvic pain.  She is in a long-term monogamous relationship with a female, who is asymptomatic.  She would like to be checked for STDs.  No recent antibiotics.  She tried a boric acid suppository with some improvement in her symptoms.  No aggravating factors.  She has a past medical history of BV and yeast and states this feels similar to both.  No history of STDs, diabetes.  LMP: October 6.  Denies possibility of being pregnant.  PCP: [facility].    Past Medical History:  Diagnosis Date   Anxiety    Depression     Past Surgical History:  Procedure Laterality Date   TONSILLECTOMY      Family History  Problem Relation Age of Onset   Neuropathy Mother     Social History   Tobacco Use   Smoking status: Never   Smokeless tobacco: Never  Vaping Use   Vaping status: Never Used  Substance Use Topics   Alcohol use: No   Drug use: Yes    Types: Marijuana    No current facility-administered medications for this encounter.  Current Outpatient Medications:    fluconazole  (DIFLUCAN ) 150 MG tablet, Take 1 tablet (150 mg total) by mouth once for 1 dose. 1 tab po x 1. May repeat in 72 hours if no improvement, Disp: 2 tablet, Rfl: 1   metroNIDAZOLE  (FLAGYL ) 500 MG tablet, Take 1 tablet (500 mg total) by mouth 2 (two) times daily for 7 days., Disp: 14 tablet, Rfl: 0   miconazole (MICOTIN) 2 % cream, Apply 1 Application topically 2 (two) times daily., Disp: 28.35 g, Rfl: 0  No Known Allergies   ROS  As noted in HPI.   Physical Exam  BP 136/63 (BP Location: Right Arm)   Pulse 89   Temp 98.9 F (37.2 C) (Oral)   Resp 14   Ht 5' 1 (1.549 m)   Wt 59 kg   LMP 10/11/2023 (Exact Date)   SpO2  98%   BMI 24.56 kg/m   Constitutional: Well developed, well nourished, no acute distress Eyes:  EOMI, conjunctiva normal bilaterally HENT: Normocephalic, atraumatic,mucus membranes moist Respiratory: Normal inspiratory effort Cardiovascular: Normal rate GI: nondistended.  Soft.  No suprapubic, flank tenderness Back: No CVAT skin: No rash, skin intact Musculoskeletal: no deformities Neurologic: Alert & oriented x 3, no focal neuro deficits Psychiatric: Speech and behavior appropriate   ED Course   Medications - No data to display  Orders Placed This Encounter  Procedures   Urine Culture    Standing Status:   Standing    Number of Occurrences:   1    Indication:   Dysuria   Urinalysis, w/ Reflex to Culture (Infection Suspected) -Urine, Clean Catch    Standing Status:   Standing    Number of Occurrences:   1    Specimen Source:   Urine, Clean Catch [76]    Results for orders placed or performed during the hospital encounter of 10/22/23 (from the past 24 hours)  Urinalysis, w/ Reflex to Culture (Infection Suspected) -Urine, Clean Catch     Status: Abnormal   Collection Time: 10/22/23 11:10 AM  Result Value Ref Range   Specimen Source URINE, CLEAN CATCH  Color, Urine YELLOW YELLOW   APPearance CLEAR CLEAR   Specific Gravity, Urine 1.025 1.005 - 1.030   pH 7.0 5.0 - 8.0   Glucose, UA NEGATIVE NEGATIVE mg/dL   Hgb urine dipstick NEGATIVE NEGATIVE   Bilirubin Urine NEGATIVE NEGATIVE   Ketones, ur NEGATIVE NEGATIVE mg/dL   Protein, ur NEGATIVE NEGATIVE mg/dL   Nitrite NEGATIVE NEGATIVE   Leukocytes,Ua NEGATIVE NEGATIVE   Squamous Epithelial / HPF 0-5 0 - 5 /HPF   WBC, UA NONE SEEN 0 - 5 WBC/hpf   RBC / HPF NONE SEEN 0 - 5 RBC/hpf   Bacteria, UA RARE (A) NONE SEEN   Mucus PRESENT    No results found.  ED Clinical Impression  1. Acute vaginitis   2. BV (bacterial vaginosis)   3. Vaginal yeast infection   4. Screening for STDs (sexually transmitted diseases)       ED Assessment/Plan    Patient has a few bacteria in her urine, but has no urinary complaints.  She otherwise has a clean UA.  Deferring urine culture.  Will send off a swab for gonorrhea, chlamydia, trichomonas, BV and yeast.  Will treat empirically for BV with Flagyl  500 mg p.o. twice daily for 7 days and yeast vaginitis with Diflucan  150 mg x 2, miconazole cream.  Follow-up with PCP as needed.  Discussed labs, MDM, treatment plan, and plan for follow-up with patient. patient agrees with plan.   Meds ordered this encounter  Medications   metroNIDAZOLE  (FLAGYL ) 500 MG tablet    Sig: Take 1 tablet (500 mg total) by mouth 2 (two) times daily for 7 days.    Dispense:  14 tablet    Refill:  0   fluconazole  (DIFLUCAN ) 150 MG tablet    Sig: Take 1 tablet (150 mg total) by mouth once for 1 dose. 1 tab po x 1. May repeat in 72 hours if no improvement    Dispense:  2 tablet    Refill:  1   miconazole (MICOTIN) 2 % cream    Sig: Apply 1 Application topically 2 (two) times daily.    Dispense:  28.35 g    Refill:  0      *This clinic note was created using Scientist, clinical (histocompatibility and immunogenetics). Therefore, there may be occasional mistakes despite careful proofreading.  ?    Van Knee, MD 10/22/23 1205

## 2023-10-23 LAB — URINE CULTURE: Culture: NO GROWTH

## 2023-10-25 LAB — CERVICOVAGINAL ANCILLARY ONLY
Bacterial Vaginitis (gardnerella): POSITIVE — AB
Candida Glabrata: NEGATIVE
Candida Vaginitis: POSITIVE — AB
Chlamydia: NEGATIVE
Comment: NEGATIVE
Comment: NEGATIVE
Comment: NEGATIVE
Comment: NEGATIVE
Comment: NEGATIVE
Comment: NORMAL
Neisseria Gonorrhea: NEGATIVE
Trichomonas: NEGATIVE

## 2023-10-26 ENCOUNTER — Ambulatory Visit (HOSPITAL_COMMUNITY): Payer: Self-pay
